# Patient Record
Sex: Male | Born: 1960 | Race: White | Hispanic: No | Marital: Married | Smoking: Never smoker
Health system: Southern US, Community
[De-identification: ages and names within clinical notes are randomized; demographics above are authoritative.]

## PROBLEM LIST (undated history)

## (undated) DIAGNOSIS — K219 Gastro-esophageal reflux disease without esophagitis: Secondary | ICD-10-CM

## (undated) HISTORY — PX: ACHILLES TENDON REPAIR: SUR1153

## (undated) HISTORY — PX: SHOULDER SURGERY: SHX246

## (undated) HISTORY — PX: JOINT REPLACEMENT: SHX530

## (undated) HISTORY — PX: CHOLECYSTECTOMY: SHX55

---

## 2007-07-14 ENCOUNTER — Ambulatory Visit: Payer: Self-pay | Admitting: Gastroenterology

## 2008-07-08 ENCOUNTER — Ambulatory Visit: Payer: Self-pay | Admitting: Oncology

## 2008-08-05 ENCOUNTER — Ambulatory Visit: Payer: Self-pay | Admitting: Oncology

## 2008-08-08 ENCOUNTER — Ambulatory Visit: Payer: Self-pay | Admitting: Oncology

## 2008-10-08 ENCOUNTER — Ambulatory Visit: Payer: Self-pay | Admitting: Oncology

## 2008-11-04 ENCOUNTER — Ambulatory Visit: Payer: Self-pay | Admitting: Oncology

## 2008-11-08 ENCOUNTER — Ambulatory Visit: Payer: Self-pay | Admitting: Oncology

## 2009-09-16 ENCOUNTER — Observation Stay: Payer: Self-pay | Admitting: Surgery

## 2010-10-23 ENCOUNTER — Ambulatory Visit: Payer: Self-pay | Admitting: Podiatry

## 2014-09-03 ENCOUNTER — Encounter: Payer: Self-pay | Admitting: Emergency Medicine

## 2014-09-03 ENCOUNTER — Ambulatory Visit
Admission: EM | Admit: 2014-09-03 | Discharge: 2014-09-03 | Disposition: A | Discharge status: Home / Self Care | Payer: BLUE CROSS/BLUE SHIELD | Attending: Family Medicine | Admitting: Family Medicine

## 2014-09-03 DIAGNOSIS — L03032 Cellulitis of left toe: Secondary | ICD-10-CM

## 2014-09-03 HISTORY — DX: Gastro-esophageal reflux disease without esophagitis: K21.9

## 2014-09-03 MED ORDER — MUPIROCIN 2 % EX OINT: 1.0000 "application " | TOPICAL_OINTMENT | Freq: Two times a day (BID) | CUTANEOUS | Status: AC

## 2014-09-03 MED ORDER — SULFAMETHOXAZOLE-TRIMETHOPRIM 800-160 MG PO TABS: 1.0000 | ORAL_TABLET | Freq: Two times a day (BID) | ORAL | Status: AC

## 2014-09-03 NOTE — ED Notes (Signed)
Pt with swelling and drainage from left 1st toe

## 2014-09-03 NOTE — ED Provider Notes (Signed)
CSN: [id number]     Arrival date & time 09/03/14  4098 History   First MD Initiated Contact with Patient 09/03/14 [phone]     Chief Complaint  Patient presents with  . Nail Problem   (Consider location/radiation/quality/duration/timing/severity/associated sxs/prior Treatment) HPI Comments: Married caucasian male here for sore left toe patient reported painful to have sheet on toe last night, red denied discharge.  Undergoing PT for bulging disk/sciatica right leg currently which is new for him started 16 Aug 2014.  Works in [employer].  Taking mobic 15mg  po daily for back pain.  Tried cutting nail shorter without relief, noticed pus bubble nail base right proximal corner  Denied trauma, shoe rubbing, stubbing toe, insect bite.  The history is provided by the patient.    Past Medical History  Diagnosis Date  . GERD (gastroesophageal reflux disease)    Past Surgical History  Procedure Laterality Date  . Cholecystectomy    . Joint replacement     History reviewed. No pertinent family history. Social History  Substance Use Topics  . Smoking status: Never Smoker   . Smokeless tobacco: None  . Alcohol Use: 0.6 oz/week    1 Cans of beer per week    Review of Systems  Constitutional: Negative for fever, chills, diaphoresis, activity change, appetite change and fatigue.  HENT: Negative for congestion, dental problem, drooling, ear discharge, ear pain, facial swelling, trouble swallowing and voice change.   Eyes: Negative for photophobia, pain, discharge, redness, itching and visual disturbance.  Respiratory: Negative for cough, chest tightness, shortness of breath and stridor.   Cardiovascular: Negative for chest pain, palpitations and leg swelling.  Gastrointestinal: Negative for nausea, vomiting, abdominal pain, diarrhea, constipation, blood in stool and abdominal distention.  Endocrine: Negative for cold intolerance and heat intolerance.  Genitourinary: Negative for dysuria,  hematuria, enuresis and difficulty urinating.  Musculoskeletal: Positive for myalgias and back pain. Negative for joint swelling, arthralgias, gait problem, neck pain and neck stiffness.  Skin: Positive for color change and rash. Negative for pallor and wound.  Allergic/Immunologic: Negative for environmental allergies and food allergies.  Neurological: Negative for dizziness, tremors, seizures, syncope, facial asymmetry, speech difficulty, weakness, light-headedness, numbness and headaches.  Hematological: Negative for adenopathy. Does not bruise/bleed easily.  Psychiatric/Behavioral: Positive for sleep disturbance. Negative for behavioral problems, confusion and agitation.    Allergies  Review of patient's allergies indicates no known allergies.  Home Medications   Prior to Admission medications   Medication Sig Start Date End Date Taking? Authorizing Provider  esomeprazole (NEXIUM) 40 MG capsule Take 40 mg by mouth daily at 12 noon.   Yes Historical Provider, MD  gabapentin (NEURONTIN) 300 MG capsule Take 300 mg by mouth at bedtime.   Yes Historical Provider, MD  meloxicam (MOBIC) 15 MG tablet Take 15 mg by mouth daily.   Yes Historical Provider, MD  tiZANidine (ZANAFLEX) 4 MG capsule Take 4 mg by mouth at bedtime as needed for muscle spasms.   Yes Historical Provider, MD  mupirocin ointment (BACTROBAN) 2 % Apply 1 application topically 2 (two) times daily. 09/03/14   Barbaraann Barthel, NP  sulfamethoxazole-trimethoprim (BACTRIM DS,SEPTRA DS) 800-160 MG per tablet Take 1 tablet by mouth 2 (two) times daily. 09/03/14   Barbaraann Barthel, NP   Meds Ordered and Administered this Visit  Medications - No data to display  BP 116/83 mmHg  Pulse 69  Temp(Src) 98.8 F (37.1 C) (Oral)  Resp 18  Ht 5' 9.5" (1.765 m)  Wt  228 lb (103.42 kg)  BMI 33.20 kg/m2 No data found.   Physical Exam  Constitutional: He is oriented to person, place, and time. Vital signs are normal. He appears  well-developed and well-nourished. No distress.  HENT:  Head: Normocephalic and atraumatic.  Right Ear: External ear normal.  Left Ear: External ear normal.  Nose: Nose normal.  Mouth/Throat: Oropharynx is clear and moist. No oropharyngeal exudate.  Eyes: Conjunctivae, EOM and lids are normal. Pupils are equal, round, and reactive to light. Right eye exhibits no discharge. Left eye exhibits no discharge. No scleral icterus.  Neck: Trachea normal and normal range of motion. Neck supple. No tracheal deviation present.  Cardiovascular: Normal rate, regular rhythm, normal heart sounds and intact distal pulses.  Exam reveals no gallop and no friction rub.   No murmur heard. Pulmonary/Chest: Effort normal and breath sounds normal. No stridor. No respiratory distress. He has no wheezes. He has no rales.  Abdominal: Soft. He exhibits no distension.  Musculoskeletal: Normal range of motion. He exhibits edema and tenderness.       Right ankle: Normal.       Left ankle: Normal.       Right lower leg: Normal.       Left lower leg: Normal.       Legs:      Right foot: Normal.       Left foot: There is tenderness and swelling. There is normal range of motion, no bony tenderness, normal capillary refill, no crepitus, no deformity and no laceration.  Lymphadenopathy:    He has no cervical adenopathy.  Neurological: He is alert and oriented to person, place, and time. He exhibits normal muscle tone. Coordination normal.  Skin: Skin is warm, dry and intact. Rash noted. No abrasion, no bruising, no burn, no ecchymosis, no laceration, no lesion, no petechiae and no purpura noted. Rash is macular and pustular. Rash is not papular, not maculopapular, not nodular, not vesicular and not urticarial. He is not diaphoretic. There is erythema. No cyanosis. No pallor. Nails show no clubbing.  Psychiatric: He has a normal mood and affect. His speech is normal and behavior is normal. Judgment and thought content normal.  Cognition and memory are normal.  Nursing note and vitals reviewed.   ED Course  Procedures (including critical care time)  Labs Review Labs Reviewed - No data to display  Imaging Review No results found.   MDM   1. Paronychia of great toe, left   Plan: 1. Test/x-ray results and diagnosis reviewed with patient 2. rx as per orders; risks, benefits, potential side effects reviewed with patient 3. Recommend supportive treatment with mobic, epsom salt soaks 4. F/u prn if symptoms worsen or don't improve  Continue Epsom salt warm/hot soaks 2-3 times per day for 20 minutes.  Take oral antibiotic Rx bactrim DS po BID x 7 days and apply bactroban ointment BID to affected area.  Monitor for red streaks up foot to ankle, fever, worsening pain in affected extremity.  Purulent discharge may develop in the next 24 hours but should decrease and resolve with oral antibiotics.  Follow up with PCM if worsening pain, red streaks, pain, and discharge.  Follow up ER if red streaks past ankle this weekend.  Work note given for doctors appt today.   Patient given [employer] handout on paronychia and cellulitis  Patient verbalized understanding of instructions, agreed with plan of care and had no further questions at this time.    Barbaraann Barthel,  NP 09/03/14 1009

## 2014-09-03 NOTE — Discharge Instructions (Signed)
Cellulitis °Cellulitis is an infection of the skin and the tissue beneath it. The infected area is usually red and tender. Cellulitis occurs most often in the arms and lower legs.  °CAUSES  °Cellulitis is caused by bacteria that enter the skin through cracks or cuts in the skin. The most common types of bacteria that cause cellulitis are staphylococci and streptococci. °SIGNS AND SYMPTOMS  °· Redness and warmth. °· Swelling. °· Tenderness or pain. °· Fever. °DIAGNOSIS  °Your health care provider can usually determine what is wrong based on a physical exam. Blood tests may also be done. °TREATMENT  °Treatment usually involves taking an antibiotic medicine. °HOME CARE INSTRUCTIONS  °· Take your antibiotic medicine as directed by your health care provider. Finish the antibiotic even if you start to feel better. °· Keep the infected arm or leg elevated to reduce swelling. °· Apply a warm cloth to the affected area up to 4 times per day to relieve pain. °· Take medicines only as directed by your health care provider. °· Keep all follow-up visits as directed by your health care provider. °SEEK MEDICAL CARE IF:  °· You notice red streaks coming from the infected area. °· Your red area gets larger or turns dark in color. °· Your bone or joint underneath the infected area becomes painful after the skin has healed. °· Your infection returns in the same area or another area. °· You notice a swollen bump in the infected area. °· You develop new symptoms. °· You have a fever. °SEEK IMMEDIATE MEDICAL CARE IF:  °· You feel very sleepy. °· You develop vomiting or diarrhea. °· You have a general ill feeling (malaise) with muscle aches and pains. °MAKE SURE YOU:  °· Understand these instructions. °· Will watch your condition. °· Will get help right away if you are not doing well or get worse. °Document Released: 10/04/2004 Document Revised: 05/11/2013 Document Reviewed: 03/12/2011 °Exit[employer]® Patient Information ©2015 [employer].  This information is not intended to replace advice given to you by your health care provider. Make sure you discuss any questions you have with your health care provider. ° °Paronychia °Paronychia is an inflammatory reaction involving the folds of the skin surrounding the fingernail. This is commonly caused by an infection in the skin around a nail. The most common cause of paronychia is frequent wetting of the hands (as seen with bartenders, food servers, nurses or others who wet their hands). This makes the skin around the fingernail susceptible to infection by bacteria (germs) or fungus. Other predisposing factors are: °· Aggressive manicuring. °· Nail biting. °· Thumb sucking. °The most common cause is a staphylococcal (a type of germ) infection, or a fungal (Candida) infection. When caused by a germ, it usually comes on suddenly with redness, swelling, pus and is often painful. It may get under the nail and form an abscess (collection of pus), or form an abscess around the nail. If the nail itself is infected with a fungus, the treatment is usually prolonged and may require oral medicine for up to one year. Your caregiver will determine the length of time treatment is required. The paronychia caused by bacteria (germs) may largely be avoided by not pulling on hangnails or picking at cuticles. When the infection occurs at the tips of the finger it is called felon. When the cause of paronychia is from the herpes simplex virus (HSV) it is called herpetic whitlow. °TREATMENT  °When an abscess is present treatment is often incision and drainage.   This means that the abscess must be cut open so the pus can get out. When this is done, the following home care instructions should be followed. °HOME CARE INSTRUCTIONS  °· It is important to keep the affected fingers very dry. Rubber or plastic gloves over cotton gloves should be used whenever the hand must be placed in water. °· Keep wound clean, dry and dressed as  suggested by your caregiver between warm soaks or warm compresses. °· Soak in warm water for fifteen to twenty minutes three to four times per day for bacterial infections. Fungal infections are very difficult to treat, so often require treatment for long periods of time. °· For bacterial (germ) infections take antibiotics (medicine which kill germs) as directed and finish the prescription, even if the problem appears to be solved before the medicine is gone. °· Only take over-the-counter or prescription medicines for pain, discomfort, or fever as directed by your caregiver. °SEEK IMMEDIATE MEDICAL CARE IF: °· You have redness, swelling, or increasing pain in the wound. °· You notice pus coming from the wound. °· You have a fever. °· You notice a bad smell coming from the wound or dressing. °Document Released: 06/20/2000 Document Revised: 03/19/2011 Document Reviewed: 02/20/2008 °ExitCare® Patient Information ©2015 [employer]. This information is not intended to replace advice given to you by your health care provider. Make sure you discuss any questions you have with your health care provider. ° °

## 2019-03-13 ENCOUNTER — Ambulatory Visit: Payer: Self-pay | Attending: Internal Medicine

## 2019-03-13 DIAGNOSIS — Z23 Encounter for immunization: Secondary | ICD-10-CM

## 2019-03-13 NOTE — Progress Notes (Signed)
   Covid-19 Vaccination Clinic  Name:  Frank Freeman    MRN: [id number] DOB: 05-29-1960  03/13/2019  [name] was observed post Covid-19 immunization for 15 minutes without incident. He was provided with Vaccine Information Sheet and instruction to access the V-Safe system.   [name] was instructed to call 911 with any severe reactions post vaccine: Marland Kitchen Difficulty breathing  . Swelling of face and throat  . A fast heartbeat  . A bad rash all over body  . Dizziness and weakness

## 2019-03-14 ENCOUNTER — Ambulatory Visit: Payer: Self-pay

## 2019-03-20 ENCOUNTER — Ambulatory Visit: Payer: BLUE CROSS/BLUE SHIELD

## 2019-04-07 ENCOUNTER — Ambulatory Visit: Payer: Self-pay | Attending: Internal Medicine

## 2019-04-07 ENCOUNTER — Ambulatory Visit: Payer: Self-pay

## 2019-04-07 DIAGNOSIS — Z23 Encounter for immunization: Secondary | ICD-10-CM

## 2019-04-07 NOTE — Progress Notes (Signed)
   Covid-19 Vaccination Clinic  Name:  Frank Freeman    MRN: [id number] DOB: 26-Jul-1960  04/07/2019  Mr. Else was observed post Covid-19 immunization for 15 minutes without incident. He was provided with Vaccine Information Sheet and instruction to access the V-Safe system.   [name] was instructed to call 911 with any severe reactions post vaccine: Marland Kitchen Difficulty breathing  . Swelling of face and throat  . A fast heartbeat  . A bad rash all over body  . Dizziness and weakness   Immunizations Administered    Name Date Dose VIS Date Route   Pfizer COVID-19 Vaccine 04/07/2019  1:31 PM 0.3 mL 12/19/2018 Intramuscular   Manufacturer: [employer], [employer]   Lot: [id number]   NDC: [address]-3

## 2019-04-14 ENCOUNTER — Ambulatory Visit: Payer: Self-pay

## 2021-11-25 ENCOUNTER — Ambulatory Visit
Admission: RE | Admit: 2021-11-25 | Discharge: 2021-11-25 | Disposition: A | Discharge status: Home / Self Care | Payer: BC Managed Care – PPO | Source: Ambulatory Visit

## 2021-11-25 VITALS — BP 127/85 | HR 60 | Temp 98.2°F | Resp 18 | Ht 70.0 in | Wt 235.0 lb

## 2021-11-25 DIAGNOSIS — J0100 Acute maxillary sinusitis, unspecified: Secondary | ICD-10-CM | POA: Diagnosis not present

## 2021-11-25 MED ORDER — AMOXICILLIN 875 MG PO TABS: 875.0000 mg | ORAL_TABLET | Freq: Two times a day (BID) | ORAL | 0 refills | Status: AC

## 2021-11-25 NOTE — ED Provider Notes (Signed)
Frank Freeman    CSN: [id number] Arrival date & time: 11/25/21  0855      History   Chief Complaint Chief Complaint  Patient presents with   Cough    Sore throat and difficult breathing - Entered by patient    HPI Frank Freeman is a 61 y.o. male.  Patient presents with 1 week history of congestion and cough.  His cough is productive of yellow-green mucus.  Denies fever, chills, shortness of breath, chest pain, vomiting, diarrhea, or other symptoms.  Treatment at home with DayQuil and NyQuil.  His medical history includes GERD.  The history is provided by the patient and medical records.    Past Medical History:  Diagnosis Date   GERD (gastroesophageal reflux disease)     There are no problems to display for this patient.   Past Surgical History:  Procedure Laterality Date   ACHILLES TENDON REPAIR Bilateral    CHOLECYSTECTOMY     SHOULDER SURGERY Right        Home Medications    Prior to Admission medications   Medication Sig Start Date End Date Taking? Authorizing Provider  amoxicillin (AMOXIL) 875 MG tablet Take 1 tablet (875 mg total) by mouth 2 (two) times daily for 7 days. 11/25/21 12/02/21 Yes Mickie Bail, NP  esomeprazole (NEXIUM) 40 MG capsule Take 40 mg by mouth daily at 12 noon.    [provider]  gabapentin (NEURONTIN) 300 MG capsule Take 300 mg by mouth at bedtime.    [provider]  meloxicam (MOBIC) 15 MG tablet Take 15 mg by mouth daily.    [provider]  mupirocin ointment (BACTROBAN) 2 % Apply 1 application topically 2 (two) times daily. 09/03/14   Betancourt, Jarold Song, NP  pantoprazole (PROTONIX) 40 MG tablet Take 40 mg by mouth daily.    [provider]  sulfamethoxazole-trimethoprim (BACTRIM DS,SEPTRA DS) 800-160 MG per tablet Take 1 tablet by mouth 2 (two) times daily. 09/03/14   Betancourt, Jarold Song, NP  tiZANidine (ZANAFLEX) 4 MG capsule Take 4 mg by mouth at bedtime as needed for muscle spasms.     [provider]    Family History History reviewed. No pertinent family history.  Social History Social History   Tobacco Use   Smoking status: Never  Substance Use Topics   Alcohol use: Yes    Alcohol/week: 1.0 standard drink of alcohol    Types: 1 Cans of beer per week     Allergies   Patient has no known allergies.   Review of Systems Review of Systems  Constitutional:  Negative for chills and fever.  HENT:  Positive for congestion. Negative for ear pain and sore throat.   Respiratory:  Positive for cough. Negative for shortness of breath.   Cardiovascular:  Negative for chest pain and palpitations.  Gastrointestinal:  Negative for diarrhea and vomiting.  Skin:  Negative for rash.  All other systems reviewed and are negative.    Physical Exam Triage Vital Signs ED Triage Vitals  Enc Vitals Group     BP 11/25/21 0911 127/85     Pulse Rate 11/25/21 0911 60     Resp 11/25/21 0911 18     Temp 11/25/21 0911 98.2 F (36.8 C)     Temp src --      SpO2 11/25/21 0911 98 %     Weight 11/25/21 0908 235 lb (106.6 kg)     Height 11/25/21 0908 5\' 10"  (1.778 m)  Head Circumference --      Peak Flow --      Pain Score 11/25/21 0905 0     Pain Loc --      Pain Edu? --      Excl. in GC? --    No data found.  Updated Vital Signs BP 127/85   Pulse 60   Temp 98.2 F (36.8 C)   Resp 18   Ht 5\' 10"  (1.778 m)   Wt 235 lb (106.6 kg)   SpO2 98%   BMI 33.72 kg/m   Visual Acuity Right Eye Distance:   Left Eye Distance:   Bilateral Distance:    Right Eye Near:   Left Eye Near:    Bilateral Near:     Physical Exam Vitals and nursing note reviewed.  Constitutional:      General: He is not in acute distress.    Appearance: Normal appearance. He is well-developed. He is not ill-appearing.  HENT:     Right Ear: Tympanic membrane normal.     Left Ear: Tympanic membrane normal.     Nose: Congestion present.     Mouth/Throat:     Mouth: Mucous  membranes are moist.     Pharynx: Oropharynx is clear.  Cardiovascular:     Rate and Rhythm: Normal rate and regular rhythm.     Heart sounds: Normal heart sounds.  Pulmonary:     Effort: Pulmonary effort is normal. No respiratory distress.     Breath sounds: Normal breath sounds.  Musculoskeletal:     Cervical back: Neck supple.  Skin:    General: Skin is warm and dry.  Neurological:     Mental Status: He is alert.  Psychiatric:        Mood and Affect: Mood normal.        Behavior: Behavior normal.      UC Treatments / Results  Labs (all labs ordered are listed, but only abnormal results are displayed) Labs Reviewed - No data to display  EKG   Radiology No results found.  Procedures Procedures (including critical care time)  Medications Ordered in UC Medications - No data to display  Initial Impression / Assessment and Plan / UC Course  I have reviewed the triage vital signs and the nursing notes.  Pertinent labs & imaging results that were available during my care of the patient were reviewed by me and considered in my medical decision making (see chart for details).    Acute sinusitis.  Treating with amoxicillin.  Discussed continuing symptomatic treatment as needed.  Instructed patient to follow up with his PCP if his symptoms are not improving.  Education provided on sinus infection.  He agrees to plan of care.    Final Clinical Impressions(s) / UC Diagnoses   Final diagnoses:  Acute non-recurrent maxillary sinusitis     Discharge Instructions      Take the amoxicillin as directed.  Follow up with your primary care provider if your symptoms are not improving.        ED Prescriptions     Medication Sig Dispense Auth. Provider   amoxicillin (AMOXIL) 875 MG tablet Take 1 tablet (875 mg total) by mouth 2 (two) times daily for 7 days. 14 tablet , NP      PDMP not reviewed this encounter.   Mickie Bail, NP 11/25/21 [phone]

## 2021-11-25 NOTE — ED Triage Notes (Addendum)
Patient to Urgent Care with complaints of cough and congestion. Describes cough as productive with green/ yellow mucus. Reports yesterday he became shortness of breath when walking. Symptoms started approx a week ago.   Denies any known fevers.   Taking dayquil/ nyquil.

## 2021-11-25 NOTE — Discharge Instructions (Addendum)
Take the amoxicillin as directed.  Follow up with your primary care provider if your symptoms are not improving.   ° ° °
